# Patient Record
Sex: Female | Born: 1975 | Race: White | Hispanic: Yes | Marital: Married | Smoking: Never smoker
Health system: Southern US, Community
[De-identification: ages and names within clinical notes are randomized; demographics above are authoritative.]

---

## 2005-11-06 ENCOUNTER — Inpatient Hospital Stay (HOSPITAL_COMMUNITY): Admission: AD | Admit: 2005-11-06 | Discharge: 2005-11-06 | Payer: Self-pay | Admitting: Obstetrics & Gynecology

## 2005-12-13 ENCOUNTER — Ambulatory Visit: Payer: Self-pay | Admitting: Obstetrics and Gynecology

## 2005-12-13 ENCOUNTER — Inpatient Hospital Stay (HOSPITAL_COMMUNITY): Admission: AD | Admit: 2005-12-13 | Discharge: 2005-12-17 | Payer: Self-pay | Admitting: Obstetrics and Gynecology

## 2005-12-13 ENCOUNTER — Ambulatory Visit: Payer: Self-pay | Admitting: *Deleted

## 2005-12-28 ENCOUNTER — Ambulatory Visit: Payer: Self-pay | Admitting: Family Medicine

## 2006-01-11 ENCOUNTER — Ambulatory Visit: Payer: Self-pay | Admitting: *Deleted

## 2006-01-18 ENCOUNTER — Inpatient Hospital Stay (HOSPITAL_COMMUNITY): Admission: RE | Admit: 2006-01-18 | Discharge: 2006-01-18 | Payer: Self-pay | Admitting: *Deleted

## 2006-01-18 ENCOUNTER — Ambulatory Visit: Payer: Self-pay | Admitting: Family Medicine

## 2006-01-22 ENCOUNTER — Ambulatory Visit: Payer: Self-pay | Admitting: Family Medicine

## 2006-02-05 ENCOUNTER — Ambulatory Visit: Payer: Self-pay | Admitting: Family Medicine

## 2006-02-19 ENCOUNTER — Ambulatory Visit: Payer: Self-pay | Admitting: Family Medicine

## 2006-03-02 ENCOUNTER — Ambulatory Visit: Payer: Self-pay | Admitting: Internal Medicine

## 2006-03-05 ENCOUNTER — Ambulatory Visit: Payer: Self-pay | Admitting: Obstetrics & Gynecology

## 2006-03-12 ENCOUNTER — Ambulatory Visit: Payer: Self-pay | Admitting: Family Medicine

## 2006-03-20 ENCOUNTER — Encounter (INDEPENDENT_AMBULATORY_CARE_PROVIDER_SITE_OTHER): Payer: Self-pay | Admitting: *Deleted

## 2006-03-20 ENCOUNTER — Inpatient Hospital Stay (HOSPITAL_COMMUNITY): Admission: AD | Admit: 2006-03-20 | Discharge: 2006-03-24 | Payer: Self-pay | Admitting: Gynecology

## 2006-03-20 ENCOUNTER — Ambulatory Visit: Payer: Self-pay | Admitting: Gynecology

## 2006-03-27 ENCOUNTER — Inpatient Hospital Stay (HOSPITAL_COMMUNITY): Admission: AD | Admit: 2006-03-27 | Discharge: 2006-03-27 | Payer: Self-pay | Admitting: Family Medicine

## 2006-04-06 ENCOUNTER — Ambulatory Visit: Payer: Self-pay | Admitting: Gynecology

## 2006-04-07 ENCOUNTER — Observation Stay (HOSPITAL_COMMUNITY): Admission: AD | Admit: 2006-04-07 | Discharge: 2006-04-08 | Payer: Self-pay | Admitting: Obstetrics and Gynecology

## 2006-04-07 ENCOUNTER — Ambulatory Visit: Payer: Self-pay | Admitting: Certified Nurse Midwife

## 2006-04-23 ENCOUNTER — Ambulatory Visit: Payer: Self-pay | Admitting: Internal Medicine

## 2011-03-24 NOTE — Op Note (Signed)
NAME:  Colleen Booth, Colleen Booth       ACCOUNT NO.:  [id number]   MEDICAL RECORD NO.:  [id number]          PATIENT TYPE:  INP   LOCATION:  9317                          FACILITY:  [facility]   PHYSICIAN:  Lesly Dukes, M.D. DATE OF BIRTH:  12/31/1975   DATE OF PROCEDURE:  12/14/2005  DATE OF DISCHARGE:                                 OPERATIVE REPORT   PREOPERATIVE DIAGNOSES:  A 35 year old female, gravida 2, para 1, at 14  weeks 5 days' estimated gestational age, with short, slightly dilated  cervix.   POSTOPERATIVE DIAGNOSES:  A 35 year old female, gravida 2, para 1, at 3  weeks 5 days' estimated gestational age, with short, slightly dilated  cervix.   PROCEDURE:  Rescue McDonald cerclage.   SURGEON:  Lesly Dukes, M.D.   ESTIMATED BLOOD LOSS:  Minimal.   COMPLICATIONS:  None.   FINDINGS:  Cervix approximately 1.5-2 cm long, fingertip dilated.   PROCEDURE:  After informed consent was obtained, the patient was taken to  the operating room, and spinal anesthesia was adequate.  The patient was  placed in the dorsal lithotomy position and prepared and draped in a normal  sterile fashion.  A Foley was placed in the bladder.  Retractors were placed  into the vagina and the cervix was grasped with Allis clamps to place it on  tension.  A 0 Prolene stitch was used to sew the cervix shut just below the  bladder reflection in a circumferential pursestring fashion.  The knot was  tied anteriorly and a tag was left.  There was good hemostasis at the end of  the procedure.  The patient tolerated the procedure well.  The sponge, lap  and instrument count were correct x2.  The patient went to recovery in  stable condition.           ______________________________  Lesly Dukes, M.D.     KHL/MEDQ  D:  12/14/2005  T:  12/14/2005  Job:  [id number]

## 2011-03-24 NOTE — Op Note (Signed)
Colleen Booth, Colleen Booth       ACCOUNT NO.:  [id number]   MEDICAL RECORD NO.:  [id number]          PATIENT TYPE:  INP   LOCATION:  9303                          [facility]ACILITY:  [facility]   PHYSICIAN:  Ginger Carne, MD  DATE OF BIRTH:  1976/02/22   DATE OF PROCEDURE:  03/20/2006  DATE OF DISCHARGE:                                 OPERATIVE REPORT   PREOPERATIVE DIAGNOSES:  1.  28-3/[redacted] weeks gestation.  2.  Preterm premature rupture of membranes.  3.  Abruptio placenta.   POSTOPERATIVE DIAGNOSES:  1.  At 28-3/[redacted] weeks gestation.  2.  Preterm premature rupture of membranes.  3.  Abruptio placenta.  4.  Partial placenta accreta on anterior uterine wall.   PROCEDURE:  Classical repeat cesarean section.   SURGEON:  Ginger Carne, MD.   ASSISTANT:  None.   COMPLICATIONS:  None immediate.   ESTIMATED BLOOD LOSS:  800 mL.   ANESTHESIA:  General.   OPERATIVE FINDINGS:  Preterm infant female delivered on Mar 23, 2006, Apgar  and weight per delivery room record, no gross abnormalities.  Baby was bulb  suctioned and handed off to the NICU staff.  Amniotic fluid was  serosanguineous with blood clots present.  There was a 5 cm anterior accreta  on the mid portion of the uterus.  The patient appeared to have had a  classical cesarean section previously.  The accreta was left in situ and  over sewn with minimal bleeding noted.  Uterus, tubes and ovaries showed  normal decidual changes of pregnancy.   OPERATIVE PROCEDURE:  The patient prepped and draped in the usual fashion  and placed in the left lateral supine position.  Betadine solution was used  for antiseptic and the patient was catheterized prior to procedure.  After  adequate general anesthesia, a vertical infraumbilical incision was made and  the abdomen opened.  Classical vertical incision made on the uterus.  Baby  delivered, cord clamped and cut and infant given to the pediatric staff  after bulb suctioning.  Placenta was  sent for pathology.  Cord bloods and  cord pH obtained.  Cord pH was compromised by minimal amount of blood and  air trapped in said blood collection.  As aforementioned, a portion of the  accreta was left in situ to avoid significant hemorrhage.  This was oversewn  with closure in a single layer with 0 Vicryl running interlocking suture  from either end to midline.  Bleeding points hemostatically checked.  Blood  clots removed.  Closure of the vertical incision in one layer with 1-0 PDS running suture,  incorporating the fascia and portions of the peritoneum and skin staples for  the skin.  Instrument and sponge count were correct.  The patient tolerated  the procedure well and returned to the post anesthesia recovery room in  excellent condition.      Ginger Carne, MD  Electronically Signed     SHB/MEDQ  D:  03/20/2006  T:  03/21/2006  Job:  [id number]

## 2011-03-24 NOTE — Discharge Summary (Signed)
Colleen Booth, Colleen Booth      ACCOUNT NO.:  [id number]   MEDICAL RECORD NO.:  [id number]          PATIENT TYPE:  INP   LOCATION:                                [facility]   PHYSICIAN:  Ginger Carne, MD  DATE OF BIRTH:  [birth year]   DATE OF ADMISSION:  03/20/2006  DATE OF DISCHARGE:  03/24/2006                                 DISCHARGE SUMMARY   DISCHARGE DIAGNOSES:  1.  Stat classical repeat cesarean section.  2.  Preterm delivery.  3.  Preterm premature rupture of membranes.  4.  Abruptio placenta.  5.  Partial placenta accreta on interior uterine wall.  6.  Aortic stenosis, mild.   DISCHARGE MEDICATIONS:  1.  Percocet 5/325 mg one tab p.o. q.4-6 h. p.r.n.  2.  Prenatal vitamins one pill daily.   HOSPITAL COURSE:  This patient is a 35 year old G2, P1-1-0-1, who presented  to the hospital at 28 weeks and 3 days after rupturing membranes. The  patient was going to be give Beta Methasone and placed on antibiotics;  however, as soon as she got to her room she had profuse vaginal bleeding and  was taken to the OR for an immediate stat C-section.  Classical C-section  was performed and a preterm infant female was delivered. Apgar and weight  per delivery room record.  Baby was bulb suctioned and handed off to NICU  staff.  Amniotic fluid was serosanguineous with blood clots present.  There  was a 5 cm anterior accreta on the mid portion of the uterus.  It appeared  that the patient had a classical C-section previously.  The patient and baby  appeared to tolerate the procedure well.  Patient lost an estimated 1000cc  of blood and required transfusion on postoperative day #1 - her hemoglobin  had dropped to 6.1.  After the transfusion the patient did well.  She plans  on breast feeding her baby.  She is rubella immune.  She refused  contraception.  Of note the patient had been diagnosed with aortic stenosis  and had followed up with a cardiologist about two weeks ago and  was told  that everything was okay and she did not need further follow-up.  The  patient was rubella immune, B positive blood type, and GBS negative.  She  was discharged home on postoperative day #4.   FOLLOW UP:  1.  The patient is to return to the maternity admissions unit in four days      to have her staples removed.  2.  She is to return for a postpartum follow-up to the [facility] in      six weeks.      Altamese Cabal, M.D.      Ginger Carne, MD  Electronically Signed    KS/MEDQ  D:  03/24/2006  T:  03/24/2006  Job:  [id number]

## 2011-03-24 NOTE — Discharge Summary (Signed)
Colleen Booth, STIGGERS       ACCOUNT NO.:  [id number]   MEDICAL RECORD NO.:  [id number]          PATIENT TYPE:  INP   LOCATION:                                [facility]   PHYSICIAN:  Towana Badger, M.D.       DATE OF BIRTH:  1975-12-28   DATE OF ADMISSION:  04/07/2006  DATE OF DISCHARGE:  04/08/2006                                 DISCHARGE SUMMARY   DISCHARGE DIAGNOSES:  1.  Postoperative vaginal bleeding.  2.  Status post classical cesarean section.  3.  Status post placental abruption with accreta.   DISCHARGE MEDICATIONS:  Percocet 5/325 1 tablet q. 4-6 h p.r.n. for pain.   HOSPITAL COURSE:  The patient is a 35 year old, G2, P1-1-0-1 status post  classical cesarean section for abruptio placenta at 28 weeks with accreta.  The patient was 19 days postpartum on the day of admission. The patient was  admitted with a chief complaint of increased vaginal bleeding. The patient  was admitted on 23-hour observation, given IV fluid, Pitocin, Cytotec, and  Methergine. The patient's admission CBC was hemoglobin 10.1. The patient's  discharge CBC was hemoglobin 9.0. Beta HCG was negative. CMP was within  normal limits. The patient was afebrile and hemodynamically stable on  admission and remained this way throughout the hospital stay. Bleeding was  noted to be moderate on admission with no clots and with a total blood  volume saturating 1 pad. Overnight the patient had very little bleeding with  only scant spotting of the pad on day of discharge with dark old lochia. The  patient's abdomen throughout hospital stay was clean, dry and intact with  respect to her surgical scar and soft and nontender on physical exam. The  patient was discharged home with instructions to return to clinic if  bleeding resumed.   FOLLOWUP:  The patient instructed to call clinic on Monday, day after  discharge, to make followup appointment given that the patient was  discharged on a weekend.   ADDITIONAL DISCHARGE INSTRUCTIONS:  No dietary restrictions, increase  activities slowly, nothing per vagina for at least another 2 weeks.      Towana Badger, M.D.     JP/MEDQ  D:  04/08/2006  T:  04/09/2006  Job:  [id number]

## 2011-03-24 NOTE — Discharge Summary (Signed)
Colleen Booth, Colleen Booth       ACCOUNT NO.:  [id number]   MEDICAL RECORD NO.:  [id number]          PATIENT TYPE:  INP   LOCATION:  9317                          [facility]ILITY[facility]   PHYSICIAN:  Phil D. Okey Dupre, M.D.     DATE OF BIRTH:  1976-01-31   DATE OF ADMISSION:  12/13/2005  DATE OF DISCHARGE:  12/17/2005                                 DISCHARGE SUMMARY   ADMITTING DIAGNOSES:  1.  14-week intrauterine pregnancy.  2.  Short cervix noted by ultrasound.  3.  Heart murmur.   DISCHARGE DIAGNOSES:  1.  14-week intrauterine pregnancy.  2.  Status post cerclage.  3.  Mild to moderate aortic valve stenosis.   PROCEDURES:  1.  Cerclage by Dr. Penne Lash.  2.  Echocardiogram done by Dr. Dorethea Clan.   ADMITTING HISTORY AND PHYSICAL:  Patient is a 35 year old G2, P1-0-0-0 who  is approximately [redacted] weeks pregnant by an 8-week ultrasound who was noted to  have short cervix on her first OB visit on February 6.  The ultrasound  showed only 3.4 mm of cervical length.  Her cervical examination also  revealed a shortened cervix.  Patient's first pregnancy was post dates and  the baby died shortly after birth.  The delivery was via emergent cesarean  section.   HOSPITAL COURSE:  Patient was admitted.  She was started on Unasyn.  She was  placed on bed rest.  She underwent her cerclage on December 14, 2005 by Dr.  Penne Lash.  Please see op note for more information.  She did have an open  cervix with the membranes showing.  A McDonald cerclage was performed.  Patient tolerated procedure well.  She remained stable after her cerclage,  complained of no bleeding.  She was without pain.  Patient remained on IV  antibiotics.  She slowly increased her activity and did well.  On admission  she was noted to have a murmur.  An echocardiogram was done on December 16, 2005 by Dr. Dorethea Clan.  Patient was found to have mild to moderate aortic valve  stenosis.  The physician on-call for [place], Dr. Dietrich Pates, was called  and  stated that he will arrange an outpatient cardiology appointment for the  patient and states that she is asymptomatic including no angina, syncope, or  shortness of breath.  Patient likely needs no further treatment except  bacterial endocarditis prophylaxis including when she presents for a vaginal  delivery.   DISCHARGE INSTRUCTIONS:  Patient was discharged to home.  Her activity was  modified bed rest.  Her medications include prenatal vitamins.  Patient is  to follow up in high risk clinic.  She will be called on Monday with an  appointment time and date.  She will be called by the cardiology with  appointment time and date as well.     ______________________________  August Saucer. Merlene Morse, MD    ______________________________  Javier Glazier Okey Dupre, M.D.    ABC/MEDQ  D:  12/17/2005  T:  12/18/2005  Job:  [id number]   cc:   [facility]

## 2013-05-31 ENCOUNTER — Encounter (HOSPITAL_COMMUNITY): Payer: Self-pay | Admitting: Emergency Medicine

## 2013-05-31 ENCOUNTER — Emergency Department (INDEPENDENT_AMBULATORY_CARE_PROVIDER_SITE_OTHER): Payer: Worker's Compensation

## 2013-05-31 ENCOUNTER — Emergency Department (INDEPENDENT_AMBULATORY_CARE_PROVIDER_SITE_OTHER)
Admission: EM | Admit: 2013-05-31 | Discharge: 2013-05-31 | Disposition: A | Discharge status: Home / Self Care | Payer: Worker's Compensation | Source: Home / Self Care | Attending: Emergency Medicine | Admitting: Emergency Medicine

## 2013-05-31 DIAGNOSIS — M549 Dorsalgia, unspecified: Secondary | ICD-10-CM

## 2013-05-31 DIAGNOSIS — W19XXXA Unspecified fall, initial encounter: Secondary | ICD-10-CM

## 2013-05-31 MED ORDER — IBUPROFEN 800 MG PO TABS: 800.0000 mg | ORAL_TABLET | Freq: Three times a day (TID) | ORAL | Status: DC | PRN

## 2013-05-31 MED ORDER — TRAMADOL HCL 50 MG PO TABS: 50.0000 mg | ORAL_TABLET | Freq: Four times a day (QID) | ORAL | Status: DC | PRN

## 2013-05-31 MED ORDER — KETOROLAC TROMETHAMINE 60 MG/2ML IM SOLN
INTRAMUSCULAR | Status: AC
  Filled 2013-05-31: qty 2

## 2013-05-31 MED ORDER — METHYLPREDNISOLONE ACETATE 40 MG/ML IJ SUSP
80.0000 mg | Freq: Once | INTRAMUSCULAR | Status: AC
  Administered 2013-05-31: 80 mg via INTRAMUSCULAR

## 2013-05-31 MED ORDER — METHYLPREDNISOLONE ACETATE 80 MG/ML IJ SUSP
INTRAMUSCULAR | Status: AC
  Filled 2013-05-31: qty 1

## 2013-05-31 MED ORDER — KETOROLAC TROMETHAMINE 60 MG/2ML IM SOLN
60.0000 mg | Freq: Once | INTRAMUSCULAR | Status: AC
  Administered 2013-05-31: 60 mg via INTRAMUSCULAR

## 2013-05-31 NOTE — ED Provider Notes (Signed)
Medical screening examination/treatment/procedure(s) were performed by non-physician practitioner and as supervising physician I was immediately available for consultation/collaboration.  Leslee Home, M.D.  Reuben Likes, MD 05/31/13 [phone]

## 2013-05-31 NOTE — ED Notes (Signed)
Pt reports walking down steps and slipped on paper causing her to fall down the steps injuring her back. States incident happened this morning around 8 a.m. Pain gradually getting worse.  Pt has not taken any otc meds for pain.

## 2013-05-31 NOTE — ED Provider Notes (Signed)
CSN: [id number]     Arrival date & time 05/31/13  1058 History     None    Chief Complaint  Patient presents with  . Fall    pt fell down steps this a.m    (Consider location/radiation/quality/duration/timing/severity/associated sxs/prior Treatment) HPI Comments: History obtained via her Ramn, CMA, translator. 37 year old female presents complaining of lower back pain radiating down both her legs since falling at work 4 hours ago. She was walking down steps and slipped on a piece of trash, falling backwards. She caught herself with her arms and did not actually impacts the ground. Since then, she has pain in the lower back midline and worked out to the right side, but there is pain on both sides of the spine as well. The pain occasionally shoots to her right leg down to about the knee. She denies any loss of bowel or bladder function, saddle anesthesia, or any other symptoms.  Patient is a 37 y.o. female presenting with fall.  Fall Pertinent negatives include no chest pain, no abdominal pain and no shortness of breath.    History reviewed. No pertinent past medical history. History reviewed. No pertinent past surgical history. History reviewed. No pertinent family history. History  Substance Use Topics  . Smoking status: Never Smoker   . Smokeless tobacco: Not on file  . Alcohol Use: No   OB History   Grav Para Term Preterm Abortions TAB SAB Ect Mult Living                 Review of Systems  Constitutional: Negative for fever and chills.  Eyes: Negative for visual disturbance.  Respiratory: Negative for cough and shortness of breath.   Cardiovascular: Negative for chest pain, palpitations and leg swelling.  Gastrointestinal: Negative for nausea, vomiting and abdominal pain.  Endocrine: Negative for polydipsia and polyuria.  Genitourinary: Negative for dysuria, urgency and frequency.  Musculoskeletal: Positive for back pain. Negative for myalgias and arthralgias.  Skin:  Negative for rash.  Neurological: Negative for dizziness, weakness and light-headedness.    Allergies  Review of patient's allergies indicates no known allergies.  Home Medications   Current Outpatient Rx  Name  Route  Sig  Dispense  Refill  . ibuprofen (ADVIL,MOTRIN) 800 MG tablet   Oral   Take 1 tablet (800 mg total) by mouth every 8 (eight) hours as needed for pain.   30 tablet   0   . traMADol (ULTRAM) 50 MG tablet   Oral   Take 1 tablet (50 mg total) by mouth every 6 (six) hours as needed for pain.   20 tablet   0    BP 133/79  Pulse 71  Temp(Src) 98.3 F (36.8 C) (Oral)  Resp 12  SpO2 100%  LMP 05/31/2013 Physical Exam  Nursing note and vitals reviewed. Constitutional: She is oriented to person, place, and time. Vital signs are normal. She appears well-developed and well-nourished. She appears distressed ( mild).  HENT:  Head: Normocephalic and atraumatic.  Eyes: EOM are normal. Pupils are equal, round, and reactive to light.  Cardiovascular: Exam reveals no friction rub.   Pulmonary/Chest: Effort normal.  Abdominal: Soft. There is no tenderness.  Musculoskeletal:       Lumbar back: She exhibits decreased range of motion, tenderness, bony tenderness and spasm. She exhibits no swelling and no deformity.  Neurological: She is alert and oriented to person, place, and time. She has normal strength and normal reflexes. No sensory deficit.  Skin: Skin is  warm and dry. No rash noted. She is not diaphoretic.  Psychiatric: She has a normal mood and affect. Her behavior is normal. Judgment normal.    ED Course   Procedures (including critical care time)  Labs Reviewed - No data to display Dg Lumbar Spine Complete  05/31/2013   *RADIOLOGY REPORT*  Clinical Data: Back pain status post fall.  LUMBAR SPINE - COMPLETE 4+ VIEW  Comparison: None.  Findings: There are five lumbar type vertebral bodies.  The alignment is normal.  The disc spaces are preserved.  There is no  evidence of acute fracture or pars defect.  There is a probable bone island within the right iliac bone.  IMPRESSION: No acute osseous findings or malalignment.   Original Report Authenticated By: Carey Bullocks, M.D.   1. Back pain   2. Fall, initial encounter     MDM  No radiographic evidence of acute bony abnormality. Toradol and Depo-Medrol given here. Treat with NSAIDs and tramadol as needed. Followup if not improving in a week   Meds ordered this encounter  Medications  . ketorolac (TORADOL) injection 60 mg    Sig:   . methylPREDNISolone acetate (DEPO-MEDROL) injection 80 mg    Sig:   . ibuprofen (ADVIL,MOTRIN) 800 MG tablet    Sig: Take 1 tablet (800 mg total) by mouth every 8 (eight) hours as needed for pain.    Dispense:  30 tablet    Refill:  0  . traMADol (ULTRAM) 50 MG tablet    Sig: Take 1 tablet (50 mg total) by mouth every 6 (six) hours as needed for pain.    Dispense:  20 tablet    Refill:  0   \  Graylon Good, PA-C 05/31/13 1644

## 2014-08-29 IMAGING — CR DG LUMBAR SPINE COMPLETE 4+V
5 series · 5 of 5 positions shown · non-contrast
Comparison: None.

CLINICAL DATA: Back pain status post fall.

LUMBAR SPINE - COMPLETE 4+ VIEW

[view not recorded (1 of 5)]
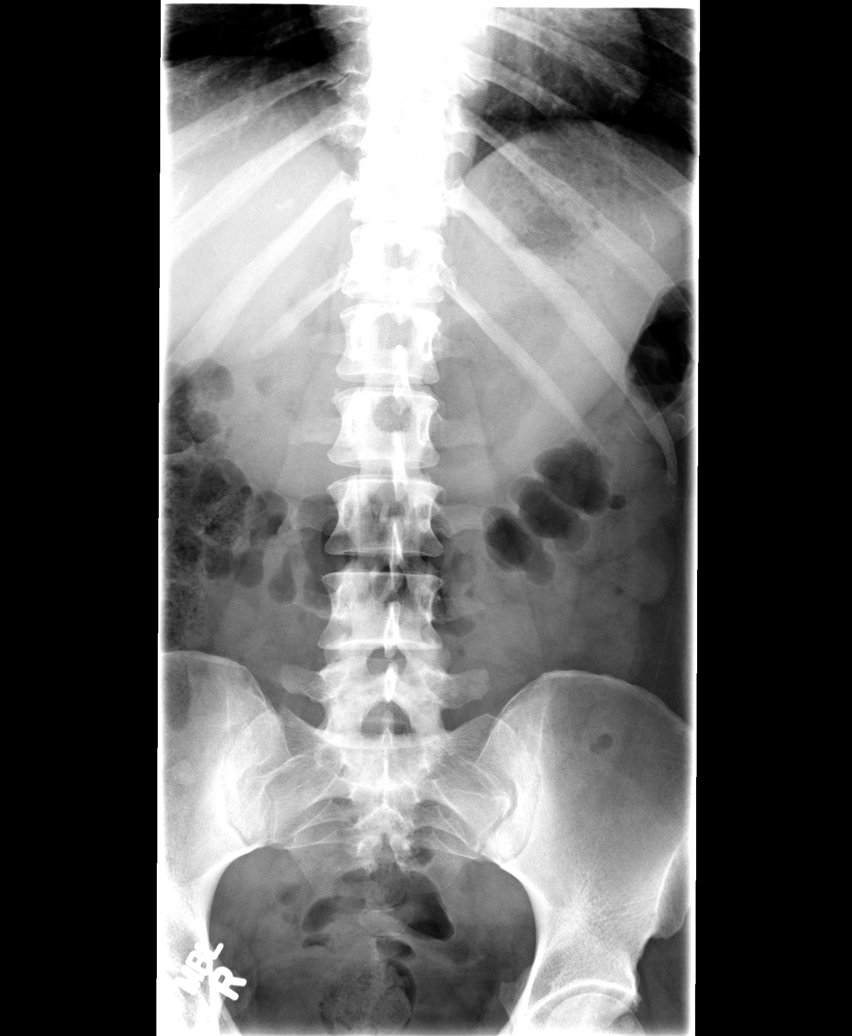

[view not recorded (2 of 5)]
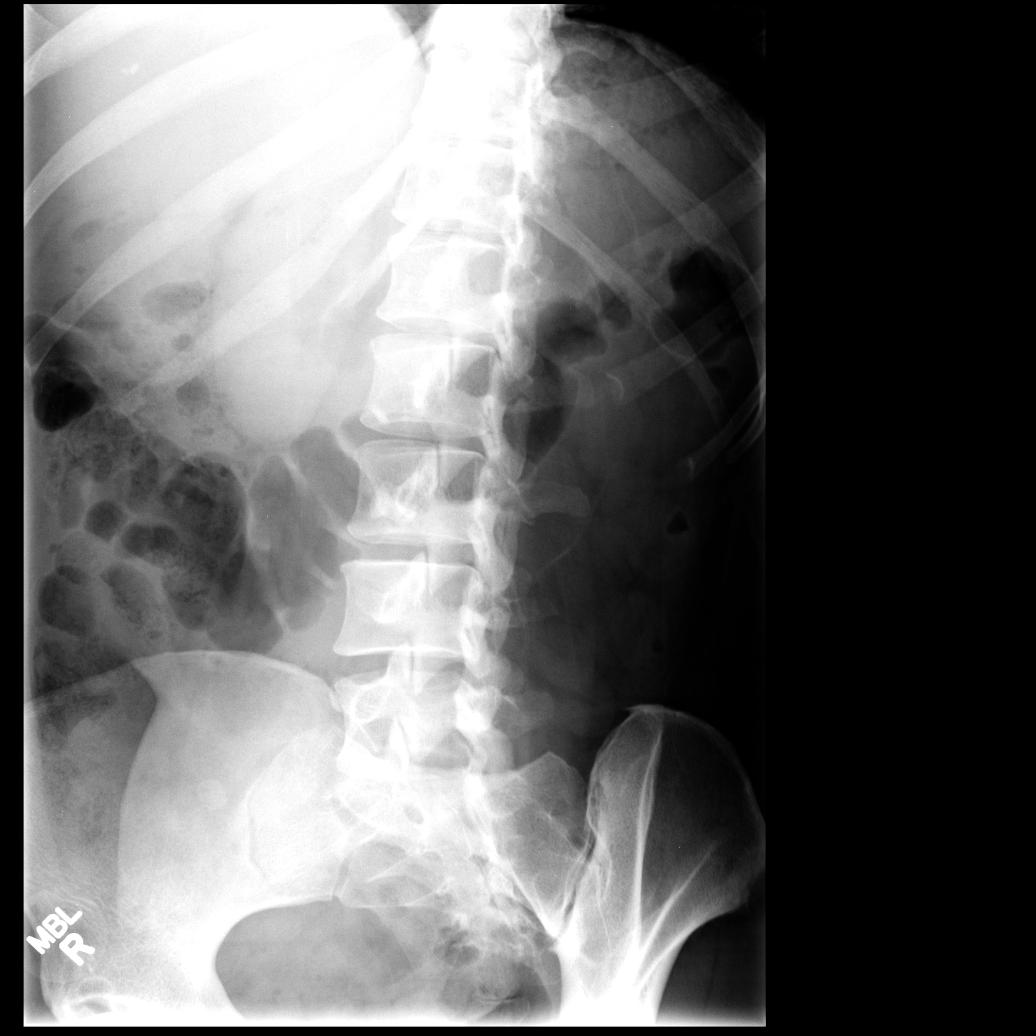

[view not recorded (3 of 5)]
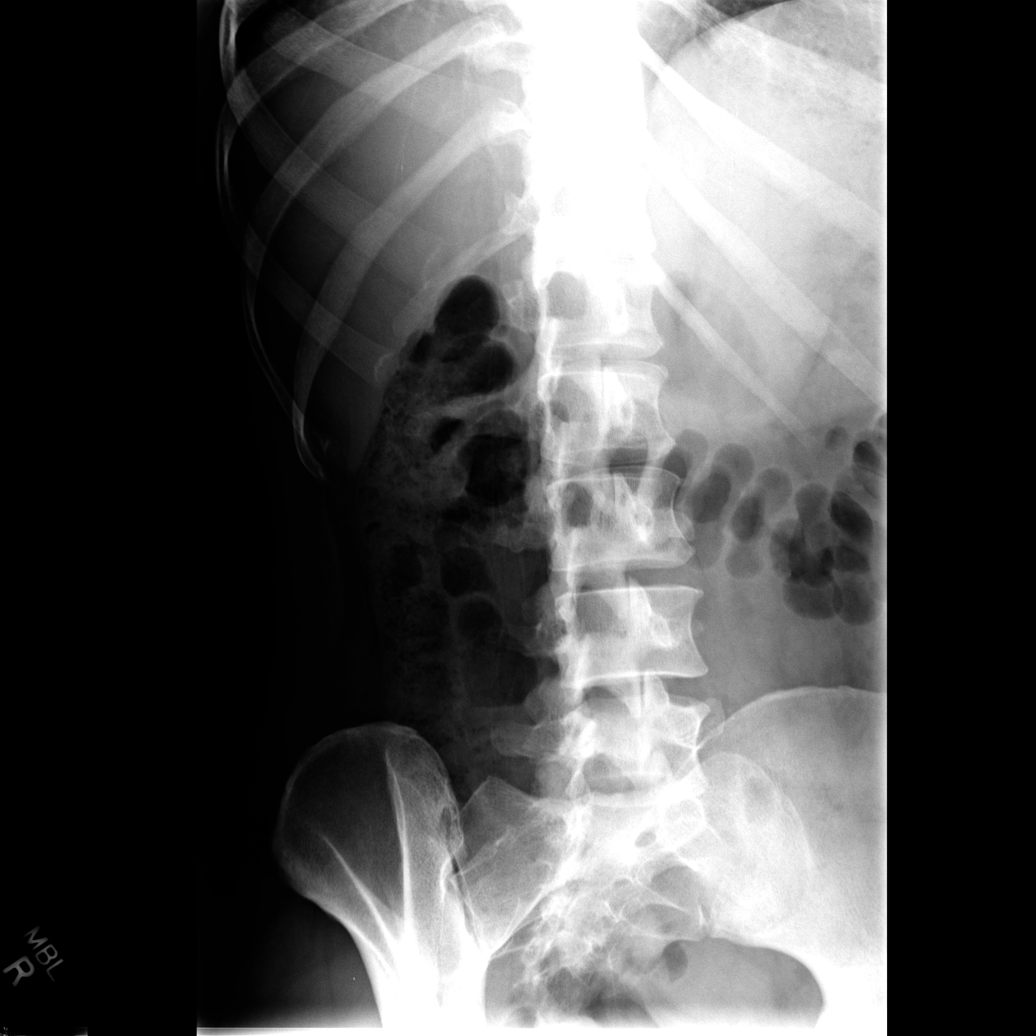

[view not recorded (4 of 5)]
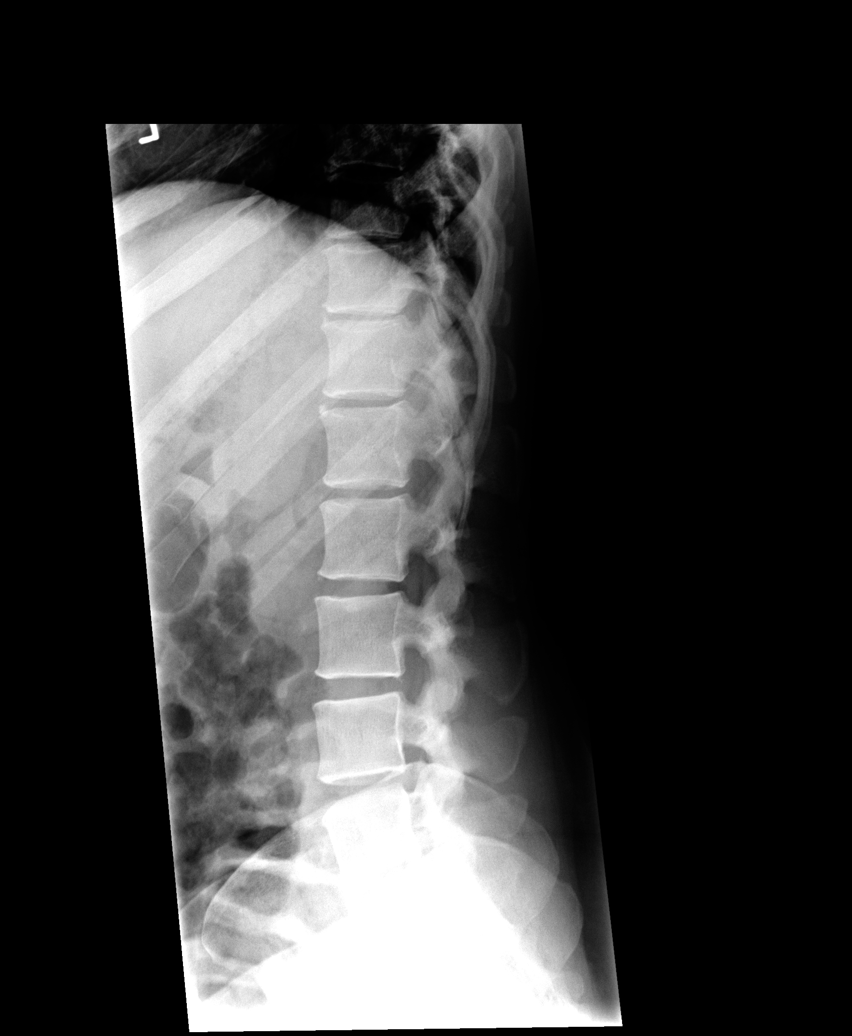

[view not recorded (5 of 5)]
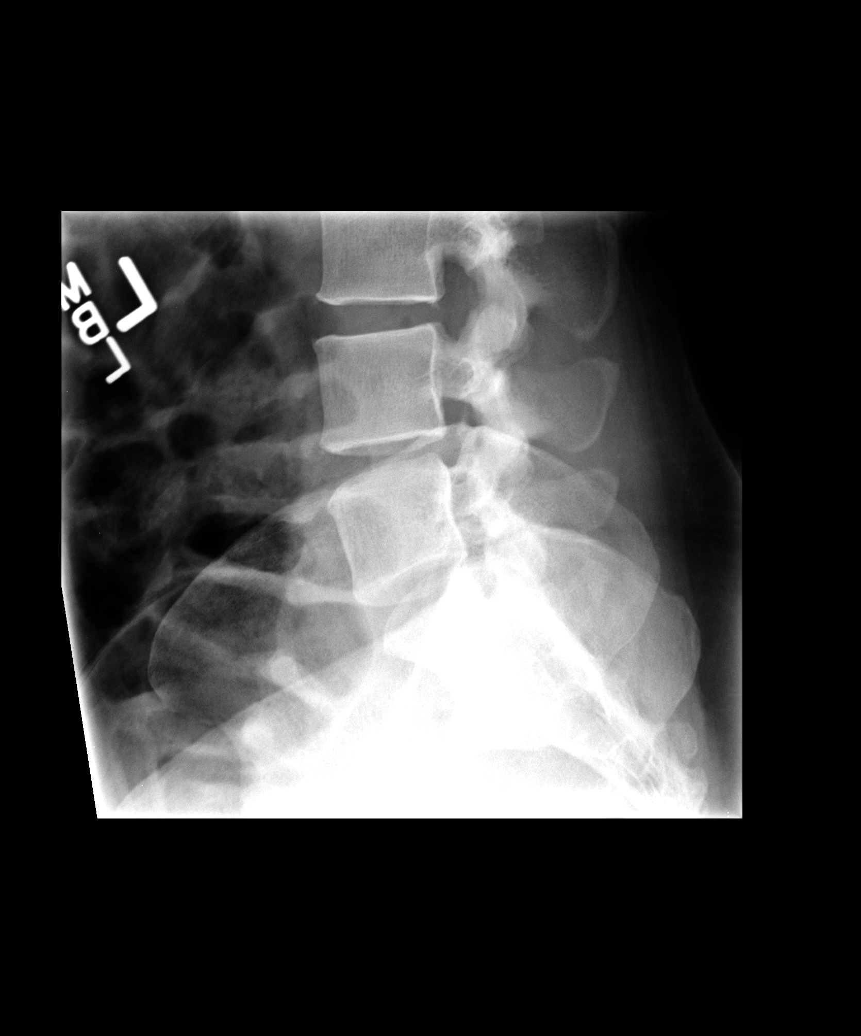

[5 of 5 positions shown; findings below may reference images not displayed]

FINDINGS: There are five lumbar type vertebral bodies.  The
alignment is normal.  The disc spaces are preserved.  There is no
evidence of acute fracture or pars defect.  There is a probable
bone island within the right iliac bone.
IMPRESSION: No acute osseous findings or malalignment.

## 2015-09-02 ENCOUNTER — Ambulatory Visit: Payer: Self-pay | Admitting: Internal Medicine

## 2017-03-20 ENCOUNTER — Other Ambulatory Visit: Payer: Self-pay | Admitting: Obstetrics & Gynecology

## 2017-03-20 DIAGNOSIS — Z1231 Encounter for screening mammogram for malignant neoplasm of breast: Secondary | ICD-10-CM

## 2017-04-17 ENCOUNTER — Ambulatory Visit
Admission: RE | Admit: 2017-04-17 | Discharge: 2017-04-17 | Disposition: A | Discharge status: Home / Self Care | Payer: No Typology Code available for payment source | Source: Ambulatory Visit | Attending: Obstetrics & Gynecology | Admitting: Obstetrics & Gynecology

## 2017-04-17 DIAGNOSIS — Z1231 Encounter for screening mammogram for malignant neoplasm of breast: Secondary | ICD-10-CM

## 2017-09-11 ENCOUNTER — Ambulatory Visit (INDEPENDENT_AMBULATORY_CARE_PROVIDER_SITE_OTHER): Payer: Self-pay | Admitting: Physician Assistant

## 2017-09-11 ENCOUNTER — Encounter (INDEPENDENT_AMBULATORY_CARE_PROVIDER_SITE_OTHER): Payer: Self-pay | Admitting: Physician Assistant

## 2017-09-11 VITALS — BP 112/74 | HR 63 | Temp 97.9°F | Ht <= 58 in | Wt 112.8 lb

## 2017-09-11 DIAGNOSIS — R5383 Other fatigue: Secondary | ICD-10-CM

## 2017-09-11 DIAGNOSIS — F329 Major depressive disorder, single episode, unspecified: Secondary | ICD-10-CM

## 2017-09-11 DIAGNOSIS — R0602 Shortness of breath: Secondary | ICD-10-CM

## 2017-09-11 DIAGNOSIS — F32A Depression, unspecified: Secondary | ICD-10-CM

## 2017-09-11 MED ORDER — ESCITALOPRAM OXALATE 10 MG PO TABS: 10.0000 mg | ORAL_TABLET | Freq: Every day | ORAL | 1 refills | Status: DC

## 2017-09-11 NOTE — Patient Instructions (Signed)
Depresin mayor (Major Depressive Disorder) La depresin mayor es un trastorno del estado de nimo. No es lo mismo que sentir tristeza cuando ocurre algo malo. Este trastorno puede durar das o semanas. Trabajar o hacer cosas con la familia y los amigos puede volverse difcil debido a este trastorno. La depresin mayor puede causar lo siguiente en las personas que la padecen:  Tristeza.  Vaco.  Desesperanza.  Impotencia.  Irritabilidad. Adems de estos sentimientos, las personas que padecen este trastorno tienen por lo menos 4de estos sntomas:  Problemas para dormir.  Dormir demasiado.  Un cambio importante en el apetito.  Un cambio importante en el peso.  Falta de energa.  Sentimientos de culpa o inutilidad.  Dificultad para concentrarse, recordar o tomar decisiones.  Movimientos lentos.  Agitacin.  Pensamientos o sueos de suicidio, o de daarse a s mismas.  Intento de suicidio. CUIDADOS EN EL HOGAR  Tome los medicamentos de venta libre y los recetados solamente como se lo haya indicado el mdico.  Concurra a todas las visitas de control como se lo haya indicado el mdico. Esto incluye cualquier terapia que le recomiende el mdico.  Reduzca el nivel de estrs. Haga cosas que disfruta, como andar en bicicleta, caminar o leer un libro.  Haga ejercicio fsico con frecuencia.  Consuma una dieta saludable.  No beba alcohol.  No consuma drogas.  Busque el apoyo de sus familiares y amigos.  SOLICITE AYUDA DE INMEDIATO SI:  Empieza a escuchar voces.  Ve cosas que no existen.  Siente que las personas lo persiguen (paranoico).  Tiene pensamientos serios acerca de lastimarse a usted mismo o daar a otras personas.  Piensa en el suicidio.  Esta informacin no tiene como fin reemplazar el consejo del mdico. Asegrese de hacerle al mdico cualquier pregunta que tenga. Document Released: 10/04/2015 Document Revised: 10/04/2015 Document Reviewed:  11/18/2012 Elsevier Interactive Patient Education  2017 Elsevier Inc.  

## 2017-09-11 NOTE — Progress Notes (Signed)
Subjective:  Patient ID: Colleen PasturesZoila Booth, female    DOB: [birth year]  Age: 41 y.o. MRN: [id number]  CC:   HPI Colleen MustacheZoila Booth is a 41 y.o. female with no significant medical history presents with shortness of breath and fatigue since two months ago. Associated with cough and clearing of the throat throughout the day. Feels mucus at the back of throat but does not endorse nasal congestion. No close contacts with the same. Never exposed to anyone with tuberculosis. Denies hx of asthma. No chest pain, palpitations, f/c/n/v, rash, or GI/GU sxs.         Outpatient Medications Prior to Visit  Medication Sig Dispense Refill  . ibuprofen (ADVIL,MOTRIN) 800 MG tablet Take 1 tablet (800 mg total) by mouth every 8 (eight) hours as needed for pain. (Patient not taking: Reported on 09/11/2017) 30 tablet 0  . traMADol (ULTRAM) 50 MG tablet Take 1 tablet (50 mg total) by mouth every 6 (six) hours as needed for pain. (Patient not taking: Reported on 09/11/2017) 20 tablet 0   No facility-administered medications prior to visit.      ROS Review of Systems  Constitutional: Positive for malaise/fatigue. Negative for chills and fever.  Eyes: Negative for blurred vision.  Respiratory: Positive for shortness of breath.   Cardiovascular: Negative for chest pain and palpitations.  Gastrointestinal: Negative for abdominal pain and nausea.  Genitourinary: Negative for dysuria and hematuria.  Musculoskeletal: Negative for joint pain and myalgias.  Skin: Negative for rash.  Neurological: Negative for tingling and headaches.  Psychiatric/Behavioral: Negative for depression. The patient is not nervous/anxious.     Objective:  BP 112/74 (BP Location: Left Arm, Patient Position: Sitting, Cuff Size: Normal)   Pulse 63   Temp 97.9 F (36.6 C) (Oral)   Ht 4' 9.48" (1.46 m)   Wt 112 lb 12.8 oz (51.2 kg)   LMP 09/03/2017 (Exact Date)   SpO2 100%   BMI 24.00 kg/m   BP/Weight 09/11/2017 05/31/2013   Systolic BP 112 133  Diastolic BP 74 79  Wt. (Lbs) 112.8 -  BMI 24 -      Physical Exam  Constitutional: She is oriented to person, place, and time.  Well developed, well nourished, NAD, polite  HENT:  Head: Normocephalic and atraumatic.  Mild postnasal drip. Oropharynx without erythema or exudates. Tonsils 2+  Eyes: No scleral icterus.  Neck: Normal range of motion. Neck supple. No thyromegaly present.  Cardiovascular: Normal rate, regular rhythm and normal heart sounds.  Pulmonary/Chest: Effort normal and breath sounds normal. No respiratory distress. She has no wheezes. She has no rales.  Musculoskeletal: She exhibits no edema.  Lymphadenopathy:    She has no cervical adenopathy.  Neurological: She is alert and oriented to person, place, and time. No cranial nerve deficit. Coordination normal.  Skin: Skin is warm and dry. No rash noted. No erythema. No pallor.  Psychiatric: She has a normal mood and affect. Her behavior is normal. Thought content normal.  Vitals reviewed.    Assessment & Plan:   1. Shortness of breath - Likely not pulmonary in etiology but will assess labs and reassess in 2-4 weeks. CXR if there is still complaint of SOB. - CBC with Differential - Comprehensive metabolic panel - TSH  2. Other fatigue - CBC with Differential - Comprehensive metabolic panel - TSH  3. Depression, unspecified depression type - PHQ9 score 12, GAD7 score 6 - Begin escitalopram   Meds ordered this encounter  Medications  . escitalopram (LEXAPRO) 10  MG tablet    Sig: Take 1 tablet (10 mg total) daily by mouth.    Dispense:  30 tablet    Refill:  1    Order Specific Question:   Supervising Provider    Answer:   Colleen Booth, Colleen E [id number]]    Follow-up: Return in about 4 weeks (around 10/09/2017) for depression.   Colleen Specteroger David Rhianon Zabawa PA

## 2017-09-12 LAB — COMPREHENSIVE METABOLIC PANEL
ALBUMIN: 4.5 g/dL (ref 3.5–5.5)
ALK PHOS: 97 IU/L (ref 39–117)
ALT: 23 IU/L (ref 0–32)
AST: 21 IU/L (ref 0–40)
Albumin/Globulin Ratio: 1.8 (ref 1.2–2.2)
BUN / CREAT RATIO: 10 (ref 9–23)
BUN: 7 mg/dL (ref 6–24)
Bilirubin Total: 0.4 mg/dL (ref 0.0–1.2)
CO2: 26 mmol/L (ref 20–29)
CREATININE: 0.69 mg/dL (ref 0.57–1.00)
Calcium: 9.4 mg/dL (ref 8.7–10.2)
Chloride: 99 mmol/L (ref 96–106)
GFR, EST AFRICAN AMERICAN: 125 mL/min/{1.73_m2} (ref >59)
GFR, EST NON AFRICAN AMERICAN: 108 mL/min/{1.73_m2} (ref >59)
GLOBULIN, TOTAL: 2.5 g/dL (ref 1.5–4.5)
GLUCOSE: 75 mg/dL (ref 65–99)
Potassium: 4.3 mmol/L (ref 3.5–5.2)
SODIUM: 140 mmol/L (ref 134–144)
TOTAL PROTEIN: 7 g/dL (ref 6.0–8.5)

## 2017-09-12 LAB — CBC WITH DIFFERENTIAL/PLATELET
BASOS ABS: 0 10*3/uL (ref 0.0–0.2)
Basos: 0 %
EOS (ABSOLUTE): 0.2 10*3/uL (ref 0.0–0.4)
EOS: 2 %
HEMATOCRIT: 36 % (ref 34.0–46.6)
HEMOGLOBIN: 12.4 g/dL (ref 11.1–15.9)
IMMATURE GRANS (ABS): 0 10*3/uL (ref 0.0–0.1)
Immature Granulocytes: 0 %
LYMPHS ABS: 2.3 10*3/uL (ref 0.7–3.1)
LYMPHS: 35 %
MCH: 30.5 pg (ref 26.6–33.0)
MCHC: 34.4 g/dL (ref 31.5–35.7)
MCV: 89 fL (ref 79–97)
MONOCYTES: 8 %
Monocytes Absolute: 0.5 10*3/uL (ref 0.1–0.9)
NEUTROS ABS: 3.6 10*3/uL (ref 1.4–7.0)
Neutrophils: 55 %
Platelets: 314 10*3/uL (ref 150–379)
RBC: 4.07 x10E6/uL (ref 3.77–5.28)
RDW: 14.2 % (ref 12.3–15.4)
WBC: 6.6 10*3/uL (ref 3.4–10.8)

## 2017-09-12 LAB — TSH: TSH: 0.699 u[IU]/mL (ref 0.450–4.500)

## 2017-09-13 ENCOUNTER — Telehealth (INDEPENDENT_AMBULATORY_CARE_PROVIDER_SITE_OTHER): Payer: Self-pay

## 2017-09-13 NOTE — Telephone Encounter (Signed)
Using pacific interpreter natalia [phone] left patient a message to call the office. If patient calls please provide  the following results. Labs are completely normal, symptoms likely due to depression and anxiety, continue taking escitalopram. Colleen Booth S Shivaay Stormont, CMA

## 2017-09-13 NOTE — Telephone Encounter (Signed)
-----   Message from Loletta Specteroger David Gomez, PA-C sent at 09/12/2017  5:42 PM EST ----- Labs are completely normal. Symptoms likely due to depression and anxiety. Continue taking escitalopram.

## 2017-10-09 ENCOUNTER — Ambulatory Visit (INDEPENDENT_AMBULATORY_CARE_PROVIDER_SITE_OTHER): Payer: Self-pay | Admitting: Physician Assistant

## 2017-10-09 ENCOUNTER — Encounter (INDEPENDENT_AMBULATORY_CARE_PROVIDER_SITE_OTHER): Payer: Self-pay | Admitting: Physician Assistant

## 2017-10-09 VITALS — BP 114/79 | HR 63 | Temp 97.9°F | Wt 112.6 lb

## 2017-10-09 DIAGNOSIS — N921 Excessive and frequent menstruation with irregular cycle: Secondary | ICD-10-CM

## 2017-10-09 DIAGNOSIS — F418 Other specified anxiety disorders: Secondary | ICD-10-CM

## 2017-10-09 DIAGNOSIS — Z114 Encounter for screening for human immunodeficiency virus [HIV]: Secondary | ICD-10-CM

## 2017-10-09 MED ORDER — ESCITALOPRAM OXALATE 10 MG PO TABS: 10.0000 mg | ORAL_TABLET | Freq: Every day | ORAL | 2 refills | Status: AC

## 2017-10-09 NOTE — Patient Instructions (Addendum)
Metrorragia (Metrorrhagia) La metrorragia es una hemorragia anormal procedente del tero. Generalmente, la hemorragia se presenta entre una menstruacin y la siguiente, y ocurre con frecuencia. CUIDADOS EN EL HOGAR Est atenta a los cambios en los sntomas. Estas indicaciones pueden ayudarla con el trastorno: Comidas  Consuma muchos tipos de alimentos.  Consuma los alimentos que contienen el nutriente llamado hierro. Algunos alimentos que contienen hierro son los siguientes: ? Hgado. ? Carne. ? Mariscos. ? Verduras de hojas verdes. ? Huevos.  Si tiene problemas para defecar (estreimiento): ? Beba abundante agua. ? Consuma frutas y verduras con alto contenido de fibra, como espinaca, zanahorias, frambuesas, manzanas y mango. Medicamentos  Tome los medicamentos de venta libre y los recetados solamente como se lo haya indicado el mdico.  No haga cambie los medicamentos sin hablar con el mdico.  No tome aspirina ni medicamentos que la contengan: ? Durante la semana previa a la menstruacin. ? Durante la menstruacin.  Tome los comprimidos de hierro exactamente como se lo haya indicado el mdico. Actividad  Debe cambiarse el apsito o el tampn ms de una vez en un lapso de 2 horas. ? Acustese con los pies elevados. ? Colquese una compresa fra en la parte baja del vientre (abdomen). ? Descanse todo lo que pueda.  No trate de adelgazar hasta que la hemorragia se detenga y los niveles de hierro en la sangre se normalicen. Otras indicaciones  Durante dos meses, anote lo siguiente: ? La fecha de comienzo de la menstruacin. ? La fecha de su finalizacin. ? Cuando tenga hemorragias que no ocurran durante la menstruacin. ? Los problemas que advierte.  Concurra a todas las visitas de control como se lo haya indicado el mdico. Esto es importante. SOLICITE AYUDA SI:  Siente mareos.  Siente como si se fuera a desmayar.  Se siente dbil.  Siente malestar estomacal  (nuseas).  Vomita.  No puede comer o tomar lquido sin vomitar.  Siente mareos mientras toma los medicamentos.  Tiene heces acuosas (diarrea) mientras toma los medicamentos.  Desea cambiar los anticonceptivos o las hormonas que toma.  Desea dejar de tomar los anticonceptivos o las hormonas. SOLICITE AYUDA DE INMEDIATO SI:  Tiene fiebre.  Tiene escalofros.  Debe cambiarse el apsito o el tampn ms de una vez en una hora.  Tiene una hemorragia vaginal que es ms abundante que antes.  Elimina cogulos de sangre por la vagina.  Tiene dolor en el vientre.  Se desmaya.  Tiene una erupcin cutnea. Esta informacin no tiene como fin reemplazar el consejo del mdico. Asegrese de hacerle al mdico cualquier pregunta que tenga. Document Released: 04/23/2012 Document Revised: 07/14/2015 Document Reviewed: 01/18/2015 Elsevier Interactive Patient Education  2018 Elsevier Inc.  

## 2017-10-09 NOTE — Progress Notes (Signed)
Subjective:  Patient ID: Colleen Booth, female    DOB: 17-Jul-1976  Age: 41 y.o. MRN: [id number]  CC: f/u depression  HPI Colleen Booth is a 41 y.o. female with a recent medical history of depression with anxiety presents to f/u on the same. Says she is feeling better and does not feel as stressed or worried. Also has noted her shortness of breath is much better. Only complaint today is of intermenstrual bleeding over the past two months. Bleeding is light. Has never happened before. Not sexually active. Does not endorse pain outside of her normal menstrual cramping. No fatigue, fever, chills, nausea, vomiting, CP, palpitations, headache, or abdominal pain.        Outpatient Medications Prior to Visit  Medication Sig Dispense Refill  . escitalopram (LEXAPRO) 10 MG tablet Take 1 tablet (10 mg total) daily by mouth. 30 tablet 1   No facility-administered medications prior to visit.      ROS Review of Systems  Constitutional: Negative for chills, fever and malaise/fatigue.  Eyes: Negative for blurred vision.  Respiratory: Negative for shortness of breath.   Cardiovascular: Negative for chest pain and palpitations.  Gastrointestinal: Negative for abdominal pain and nausea.  Genitourinary: Negative for dysuria and hematuria.  Musculoskeletal: Negative for joint pain and myalgias.  Skin: Negative for rash.  Neurological: Negative for tingling and headaches.  Psychiatric/Behavioral: Negative for depression. The patient is not nervous/anxious.     Objective:  BP 114/79 (BP Location: Left Arm, Patient Position: Sitting, Cuff Size: Normal)   Pulse 63   Temp 97.9 F (36.6 C) (Oral)   Wt 112 lb 9.6 oz (51.1 kg)   LMP 08/15/2017 (Approximate)   SpO2 97%   BMI 23.96 kg/m   BP/Weight 10/09/2017 09/11/2017 05/31/2013  Systolic BP 114 112 133  Diastolic BP 79 74 79  Wt. (Lbs) 112.6 112.8 -  BMI 23.96 24 -      Physical Exam  Constitutional: She is oriented to  person, place, and time.  Well developed, well nourished, NAD, polite  HENT:  Head: Normocephalic and atraumatic.  Eyes: No scleral icterus.  Neck: Normal range of motion. Neck supple. No thyromegaly present.  Cardiovascular: Normal rate, regular rhythm and normal heart sounds.  Pulmonary/Chest: Effort normal and breath sounds normal.  Abdominal: Soft. Bowel sounds are normal. There is no tenderness.  Musculoskeletal: She exhibits no edema.  Neurological: She is alert and oriented to person, place, and time.  Skin: Skin is warm and dry. No rash noted. No erythema. No pallor.  Psychiatric: She has a normal mood and affect. Her behavior is normal. Thought content normal.  Vitals reviewed.    Assessment & Plan:    1. Depression with anxiety - Refill escitalopram (LEXAPRO) 10 MG tablet; Take 1 tablet (10 mg total) by mouth daily.  Dispense: 30 tablet; Refill: 2  2. Screening for HIV (human immunodeficiency virus) - HIV antibody  3. Metrorrhagia - US Pelvis Complete; Future - US Transvaginal Non-OB; Future   Meds ordered this encounter  Medications  . escitalopram (LEXAPRO) 10 MG tablet    Sig: Take 1 tablet (10 mg total) by mouth daily.    Dispense:  30 tablet    Refill:  2    Order Specific Question:   Supervising Provider    Answer:   Quentin AngstJEGEDE, OLUGBEMIGA E [id number]    Follow-up: Return in about 8 weeks (around 12/04/2017) for depression with anxiety.   Loletta Specteroger David Laurelle Skiver PA

## 2017-10-10 ENCOUNTER — Ambulatory Visit: Payer: Self-pay | Attending: Physician Assistant | Admitting: *Deleted

## 2017-10-10 ENCOUNTER — Telehealth (INDEPENDENT_AMBULATORY_CARE_PROVIDER_SITE_OTHER): Payer: Self-pay

## 2017-10-10 DIAGNOSIS — Z23 Encounter for immunization: Secondary | ICD-10-CM | POA: Insufficient documentation

## 2017-10-10 LAB — HIV ANTIBODY (ROUTINE TESTING W REFLEX): HIV Screen 4th Generation wRfx: NONREACTIVE

## 2017-10-10 NOTE — Telephone Encounter (Signed)
-----   Message from Loletta Specteroger David Gomez, PA-C sent at 10/10/2017  8:46 AM EST ----- HIV negative.

## 2017-10-10 NOTE — Progress Notes (Signed)
tdap- left Flu r  Patient here today for Tdap  injection. Administered in left deltoid.  Site unremarkable & patient tolerated injection.     Patient here today for influenza injection.  Administered in left deltoid.  Site unremarkable & patient tolerated injection.

## 2017-10-10 NOTE — Telephone Encounter (Signed)
Using pacific interpreters [phone]) patient aware of negative HIV results. Colleen Mornempestt S Colleen Booth, CMA

## 2017-10-12 ENCOUNTER — Ambulatory Visit (HOSPITAL_COMMUNITY): Payer: Self-pay

## 2017-12-04 ENCOUNTER — Ambulatory Visit (INDEPENDENT_AMBULATORY_CARE_PROVIDER_SITE_OTHER): Payer: Self-pay | Admitting: Physician Assistant

## 2020-03-11 ENCOUNTER — Emergency Department (HOSPITAL_COMMUNITY): Payer: Self-pay

## 2020-03-11 ENCOUNTER — Other Ambulatory Visit: Payer: Self-pay

## 2020-03-11 ENCOUNTER — Emergency Department (HOSPITAL_COMMUNITY)
Admission: EM | Admit: 2020-03-11 | Discharge: 2020-03-11 | Disposition: A | Discharge status: Home / Self Care | Payer: Self-pay | Attending: Emergency Medicine | Admitting: Emergency Medicine

## 2020-03-11 ENCOUNTER — Encounter (HOSPITAL_COMMUNITY): Payer: Self-pay | Admitting: Emergency Medicine

## 2020-03-11 DIAGNOSIS — Z20822 Contact with and (suspected) exposure to covid-19: Secondary | ICD-10-CM | POA: Insufficient documentation

## 2020-03-11 DIAGNOSIS — Z79899 Other long term (current) drug therapy: Secondary | ICD-10-CM | POA: Insufficient documentation

## 2020-03-11 DIAGNOSIS — R0602 Shortness of breath: Secondary | ICD-10-CM | POA: Insufficient documentation

## 2020-03-11 LAB — BASIC METABOLIC PANEL
Anion gap: 9 (ref 5–15)
BUN: 6 mg/dL (ref 6–20)
CO2: 26 mmol/L (ref 22–32)
Calcium: 8.8 mg/dL — ABNORMAL LOW (ref 8.9–10.3)
Chloride: 100 mmol/L (ref 98–111)
Creatinine, Ser: 0.71 mg/dL (ref 0.44–1.00)
GFR calc Af Amer: >60 mL/min (ref >60)
GFR calc non Af Amer: >60 mL/min (ref >60)
Glucose, Bld: 118 mg/dL — ABNORMAL HIGH (ref 70–99)
Potassium: 3.5 mmol/L (ref 3.5–5.1)
Sodium: 135 mmol/L (ref 135–145)

## 2020-03-11 LAB — CBC
HCT: 37.5 % (ref 36.0–46.0)
Hemoglobin: 12.5 g/dL (ref 12.0–15.0)
MCH: 30.3 pg (ref 26.0–34.0)
MCHC: 33.3 g/dL (ref 30.0–36.0)
MCV: 91 fL (ref 80.0–100.0)
Platelets: 268 10*3/uL (ref 150–400)
RBC: 4.12 MIL/uL (ref 3.87–5.11)
RDW: 12.6 % (ref 11.5–15.5)
WBC: 6.7 10*3/uL (ref 4.0–10.5)
nRBC: 0 % (ref 0.0–0.2)

## 2020-03-11 LAB — SARS CORONAVIRUS 2 (TAT 6-24 HRS): SARS Coronavirus 2: NEGATIVE

## 2020-03-11 MED ORDER — DEXAMETHASONE SODIUM PHOSPHATE 10 MG/ML IJ SOLN: 10.0000 mg | Freq: Once | INTRAMUSCULAR | Status: DC

## 2020-03-11 MED ORDER — ALBUTEROL SULFATE HFA 108 (90 BASE) MCG/ACT IN AERS
4.0000 | INHALATION_SPRAY | Freq: Once | RESPIRATORY_TRACT | Status: AC
Start: 2020-03-11 — End: 2020-03-11
  Administered 2020-03-11: 16:00:00 4 via RESPIRATORY_TRACT
  Filled 2020-03-11: qty 6.7

## 2020-03-11 MED ORDER — PREDNISONE 10 MG (21) PO TBPK
ORAL_TABLET | Freq: Every day | ORAL | 0 refills | Status: AC
Start: 2020-03-11 — End: ?

## 2020-03-11 MED ORDER — DEXAMETHASONE SODIUM PHOSPHATE 10 MG/ML IJ SOLN
10.0000 mg | Freq: Once | INTRAMUSCULAR | Status: AC
  Administered 2020-03-11: 10 mg via INTRAMUSCULAR
  Filled 2020-03-11: qty 1

## 2020-03-11 MED FILL — predniSONE 10 MG TABS: 10 | 10 days supply | Qty: 42 | Fill #0

## 2020-03-11 NOTE — ED Notes (Signed)
Patient Alert and oriented to baseline. Stable and ambulatory to baseline. Patient verbalized understanding of the discharge instructions.  Patient belongings were taken by the patient.   

## 2020-03-11 NOTE — ED Provider Notes (Signed)
[facility] EMERGENCY DEPARTMENT Provider Note   CSN: [id number] Arrival date & time: 03/11/20  1108     History Chief Complaint  Patient presents with  . Shortness of Breath    Colleen Booth is a 44 y.o. female.  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  In January 2020 she had tracheal dilation with recurrence of tracheal stenosis which resulted in tracheal dilation and steroid injection 5/20.   Patient presents today with which she states feels like similar symptoms to her subglottic stenosis with shortness of breath seems to be worse when she is exercising or exerting herself in a way that requires harder breathing.   Patient states that she has had some cough, congestion, sore throat, subjective fevers and chills but denies any true fevers at home.  Patient follows with [facility] and was in [facility] with Dr. Juanito Doom.  She states that with her insurance she is currently unable to afford additional dilation surgeries/procedures.      History reviewed. No pertinent past medical history.  There are no problems to display for this patient.   History reviewed. No pertinent surgical history.   OB History   No obstetric history on file.     No family history on file.  Social History   Tobacco Use  . Smoking status: Never Smoker  . Smokeless tobacco: Never Used  Substance Use Topics  . Alcohol use: No  . Drug use: No    Home Medications Prior to Admission medications   Medication Sig Start Date End Date Taking? Authorizing Provider  escitalopram (LEXAPRO) 10 MG tablet Take 1 tablet (10 mg total) by mouth daily. 10/09/17   Clent Demark, PA-C  predniSONE (STERAPRED UNI-PAK 21 TAB) 10 MG (21) TBPK tablet Take by mouth daily. Take 6 tabs by mouth daily  for 2 days, then 5 tabs for 2 days, then 4 tabs for 2 days,  then 3 tabs for 2 days, 2 tabs for 2 days, then 1 tab by mouth daily for 2 days 03/11/20   Tedd Sias, PA    Allergies    Patient has no known allergies.  Review of Systems   Review of Systems  Constitutional: Negative for chills and fever.  HENT: Negative for congestion.   Eyes: Negative for pain.  Respiratory: Positive for shortness of breath. Negative for cough.   Cardiovascular: Negative for chest pain and leg swelling.  Gastrointestinal: Negative for abdominal pain and vomiting.  Genitourinary: Negative for dysuria.  Musculoskeletal: Negative for myalgias.  Skin: Negative for rash.  Neurological: Negative for dizziness and headaches.    Physical Exam Updated Vital Signs BP 105/72   Pulse 83   Temp 99.5 F (37.5 C) (Oral)   Resp 17   LMP 03/10/2020 (Exact Date)   SpO2 100%    Physical Exam Vitals and nursing note reviewed.  Constitutional:      General: She is not in acute distress. HENT:     Head: Normocephalic and atraumatic.     Nose: Nose normal.     Mouth/Throat:     Mouth: Mucous membranes are moist.  Eyes:     General: No scleral icterus. Cardiovascular:     Rate and Rhythm: Normal rate and regular rhythm.     Pulses: Normal pulses.     Heart sounds: Normal heart sounds.  Pulmonary:  Effort: Pulmonary effort is normal. No respiratory distress.     Comments: Patient speaking full sentences without difficulty.  No accessory muscle usage.  Upper airway noises auscultated as inspiratory and expiratory whistling. No true stridor.  Abdominal:     Palpations: Abdomen is soft.     Tenderness: There is no abdominal tenderness. There is no guarding.  Musculoskeletal:     Cervical back: Normal range of motion.     Right lower leg: No edema.     Left lower leg: No edema.  Skin:    General: Skin is warm and dry.     Capillary Refill: Capillary refill takes less than 2 seconds.  Neurological:     Mental Status: She is alert. Mental status is at  baseline.  Psychiatric:        Mood and Affect: Mood normal.        Behavior: Behavior normal.     ED Results / Procedures / Treatments   Labs (all labs ordered are listed, but only abnormal results are displayed) Labs Reviewed  BASIC METABOLIC PANEL - Abnormal; Notable for the following components:      Result Value   Glucose, Bld 118 (*)    Calcium 8.8 (*)    All other components within normal limits  SARS CORONAVIRUS 2 (TAT 6-24 HRS)  CBC    EKG EKG Interpretation  Date/Time:  Thursday Mar 11 2020 11:10:11 EDT Ventricular Rate:  95 PR Interval:  148 QRS Duration: 78 QT Interval:  332 QTC Calculation: 417 R Axis:   86 Text Interpretation: Normal sinus rhythm Right atrial enlargement Cannot rule out Anterior infarct , age undetermined Abnormal ECG No STEMI Confirmed by Alvester Chou [id number]) on 03/11/2020 2:37:36 PM   Radiology DG Chest 2 View  Result Date: 03/11/2020 CLINICAL DATA:  Shortness breath started yesterday. Mild nausea for 2 days. EXAM: CHEST - 2 VIEW COMPARISON:  None. FINDINGS: The heart size and mediastinal contours are within normal limits. Both lungs are clear. The visualized skeletal structures are unremarkable. IMPRESSION: No active cardiopulmonary disease. Electronically Signed   By: Norva Pavlov M.D.   On: 03/11/2020 11:43    Procedures Procedures (including critical care time)  Medications Ordered in ED Medications  albuterol (VENTOLIN HFA) 108 (90 Base) MCG/ACT inhaler 4 puff (4 puffs Inhalation Given 03/11/20 1608)  dexamethasone (DECADRON) injection 10 mg (10 mg Intramuscular Given 03/11/20 1608)    ED Course  I have reviewed the triage vital signs and the nursing notes.  Pertinent labs & imaging results that were available during my care of the patient were reviewed by me and considered in my medical decision making (see chart for details).     Clinical Course as of Mar 11 1617  Thu Mar 11, 2020  1505 Chest x-ray independently viewed  by myself there is no evidence of acute cardiopulmonary disease.  No infiltrate or evidence of infection.  No pneumothorax.   [WF]  1506 EKG intimately viewed by myself.  There is no acute abnormality.  No evidence of ischemia.   [WF]  1551 CBC without acute abnormality.  CMP without acute abnormality.  Covid test obtained and pending.   [WF]    Clinical Course User Index [WF] Gailen Shelter, [place]   MDM Rules/Calculators/A&P                        Patient is 44 year old female with a past medical history detailed above.  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  Most recently 1 year ago in May 2020.  Patient has history consistent with URI which may be acutely worsening her subglottic stenosis.  She is not acutely hypoxic, exam has no accessory muscle use or evidence of hypoxia.  She is well-appearing and apart from his right expiratory stridor which has been documented on past exams for the past year has no abnormal findings.  Plan is to ambulate patient to make sure that she does not become hypoxic.  Will provide her with Decadron and nebulizer   I discussed this case with my attending physician who cosigned this note including patient's presenting symptoms, physical exam, and planned diagnostics and interventions. Attending physician stated agreement with plan or made changes to plan which were implemented.   Attending physician assessed patient at bedside.  Discussed with Dr. Sharen Counter of [facility].  Per Dr. Sharen Counter recommneds Decadron and albuterol inhaler.  Plan to discharge with prednisone Dosepak, humidified air recommendations and strict return precautions.  She can follow-up with [facility] tomorrow or Monday.  I would recommend that she call the practice to ensure this however her ENT has assured me that she will have close follow-up arranged.  Ambulated without issue does not become hypoxic.  Will discharge with steroid  Dosepak and conditions to use humidified air.  She has close follow-up and return precautions.  Final Clinical Impression(s) / ED Diagnoses Final diagnoses:  Shortness of breath    Rx / DC Orders ED Discharge Orders         Ordered    predniSONE (STERAPRED UNI-PAK 21 TAB) 10 MG (21) TBPK tablet  Daily     03/11/20 1558           Solon Augusta [place], [place] 03/11/20 1619    Terald Sleeper, MD 03/11/20 1806

## 2020-03-11 NOTE — ED Triage Notes (Signed)
Spanish interpretor utilized to obtain history.  Pt reports sob, cough and fever. Pt reports cough is productive. Denies recent sick contacts. Pt reports that she has a problems with her vocal cords "closing" and reports she had a surgery but feels like this is occurring again.

## 2020-03-11 NOTE — ED Notes (Signed)
Pt ambulated in hall. Oxygen saturation remained at 99%-100%. Pt is now back in bed.

## 2020-03-11 NOTE — Discharge Instructions (Signed)
Please call today to verify your appointment with Dr. Sharen Counter of ear nose and throat.  Please take prednisone as prescribed.  If your symptoms worsen during open hours for ear nose and throat clinic please be seen at your ear nose and throat clinic as they have requested this.  However if you have suddenly worsening symptoms please go immediately to the closest emergency room.    Sung Amabile, MD   [address]   [place], [place] [address]   [phone]   [phone] (Fax)

## 2021-06-09 IMAGING — CR DG CHEST 2V
3 series · 4 of 4 positions shown · non-contrast
Comparison: None.

CLINICAL DATA: Shortness breath started yesterday. Mild nausea for
2 days.

EXAM:
CHEST - 2 VIEW

[chest lat]
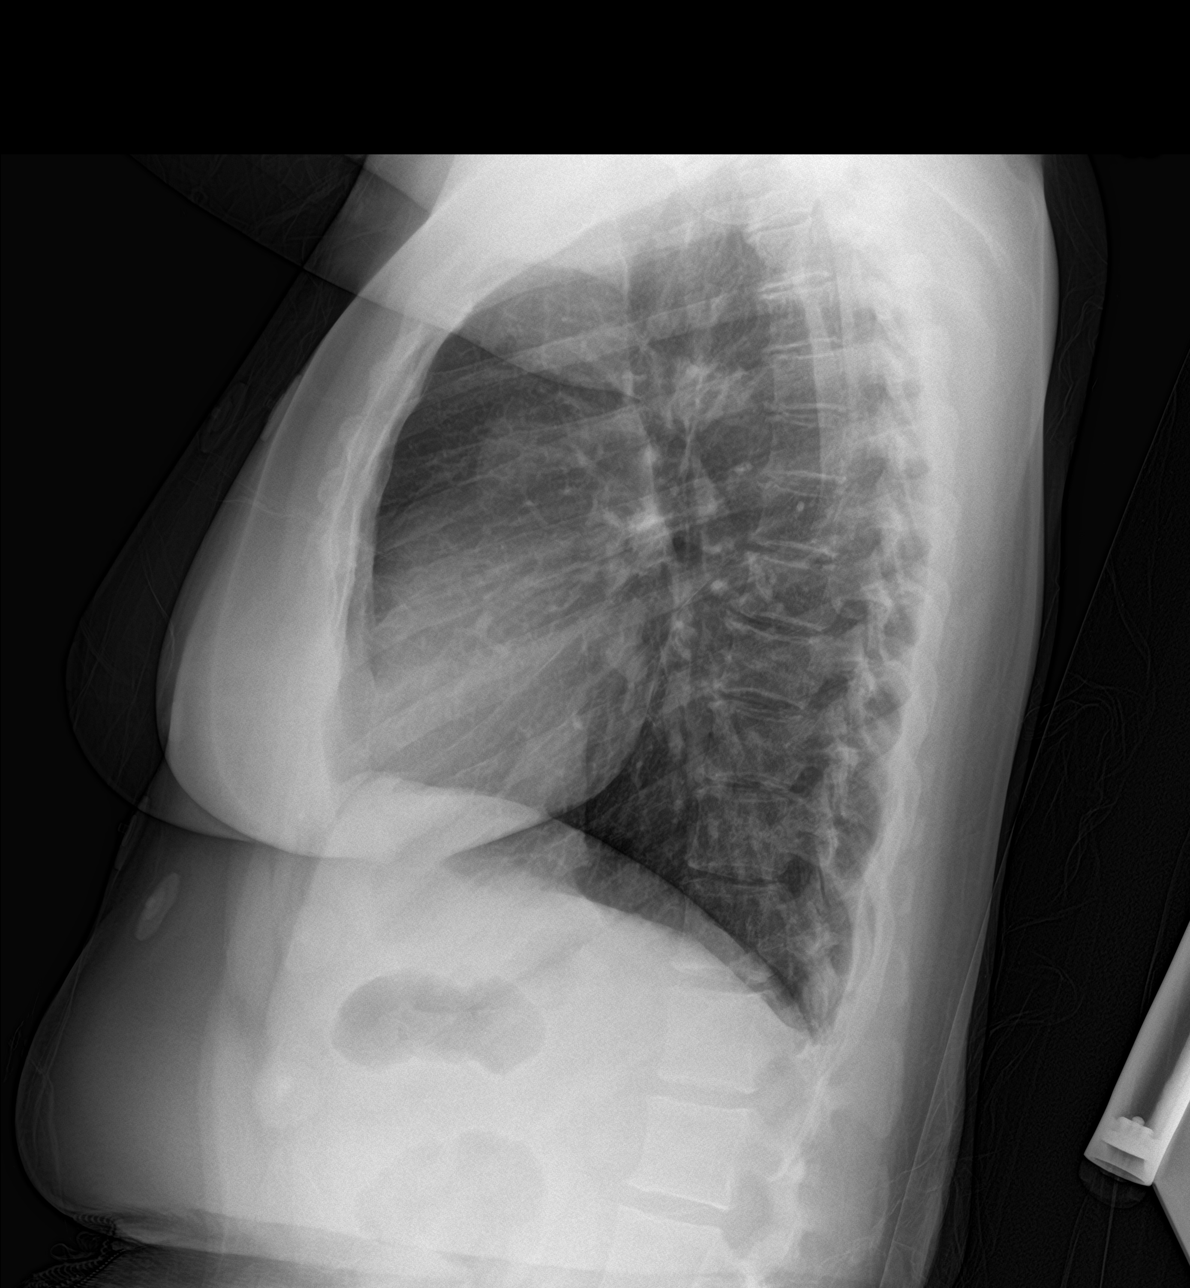

[Series 3: chest ap · 0.14mm/px · 2 of 2 slices shown (1 of 2)]
[im 1/2]
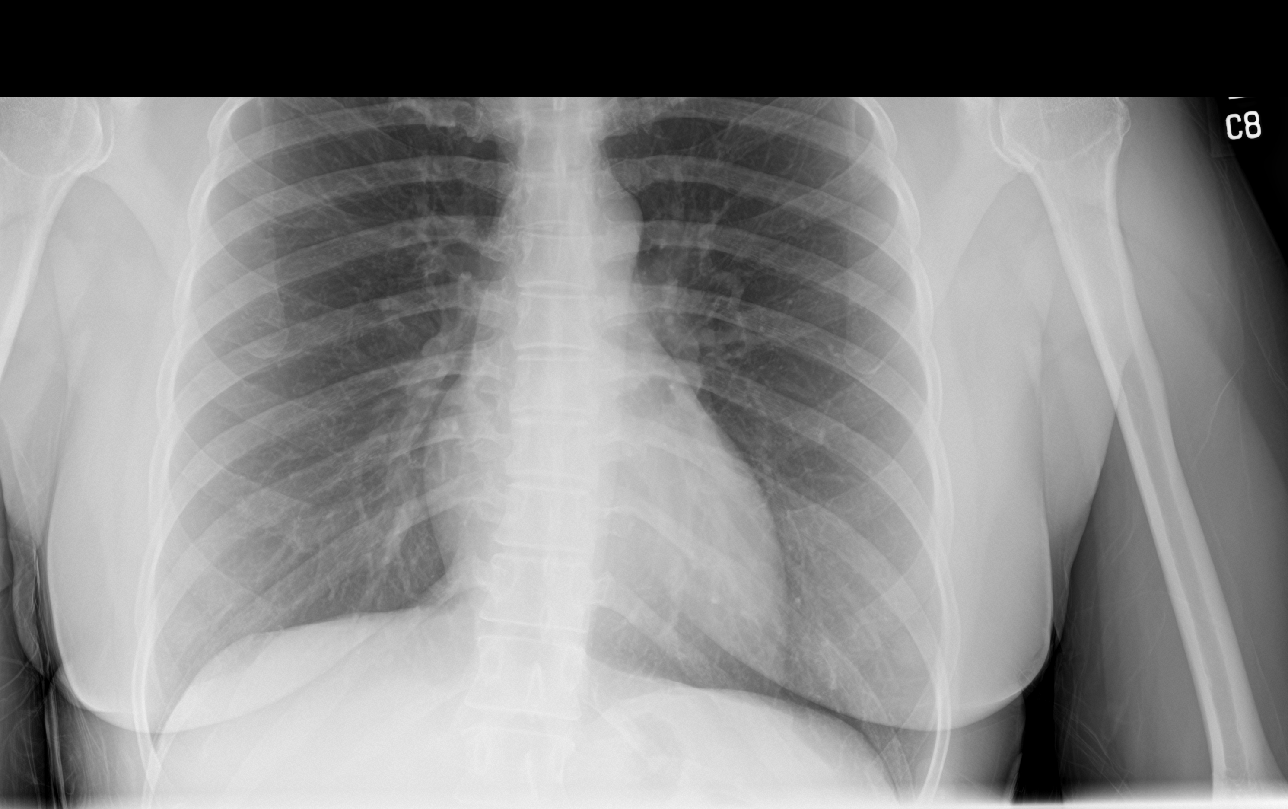
[im 2/2]
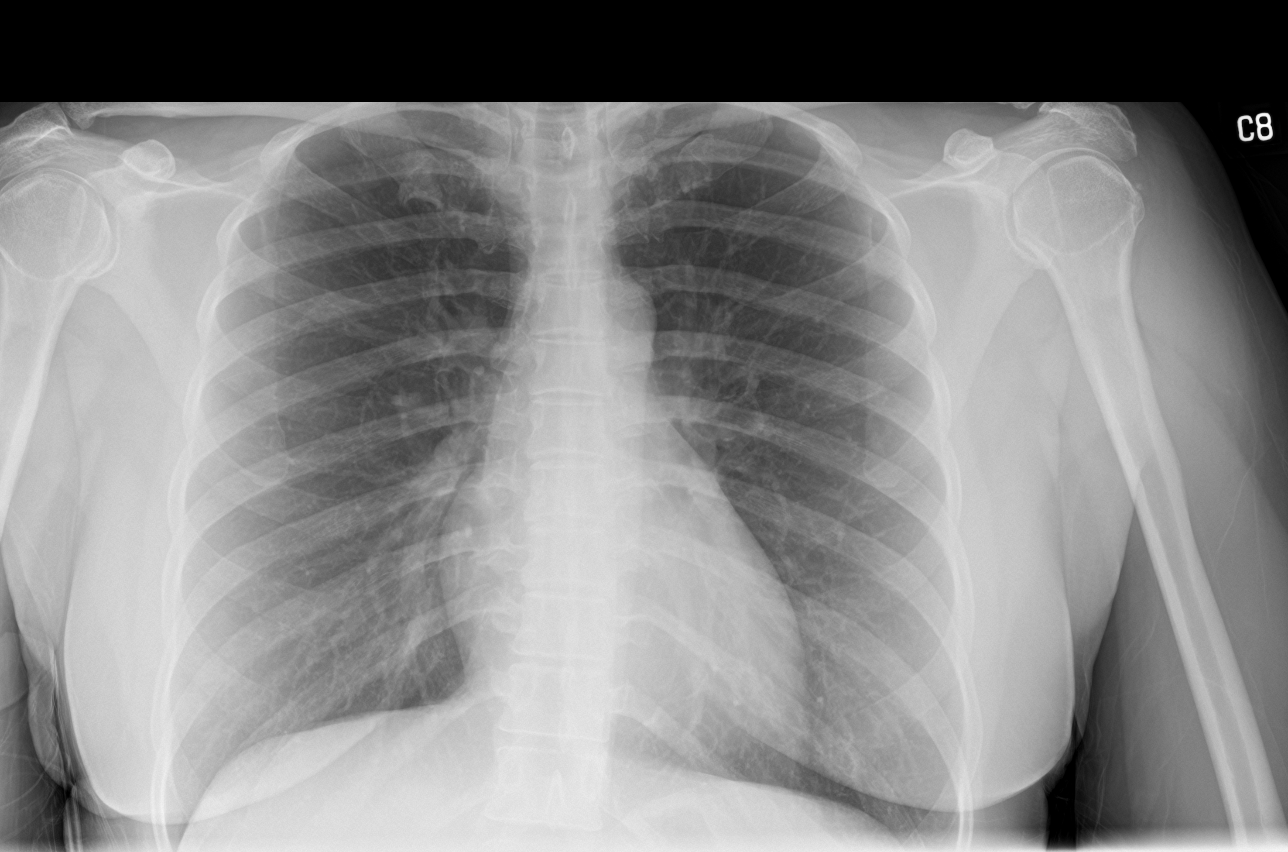

[chest ap (2 of 2)]
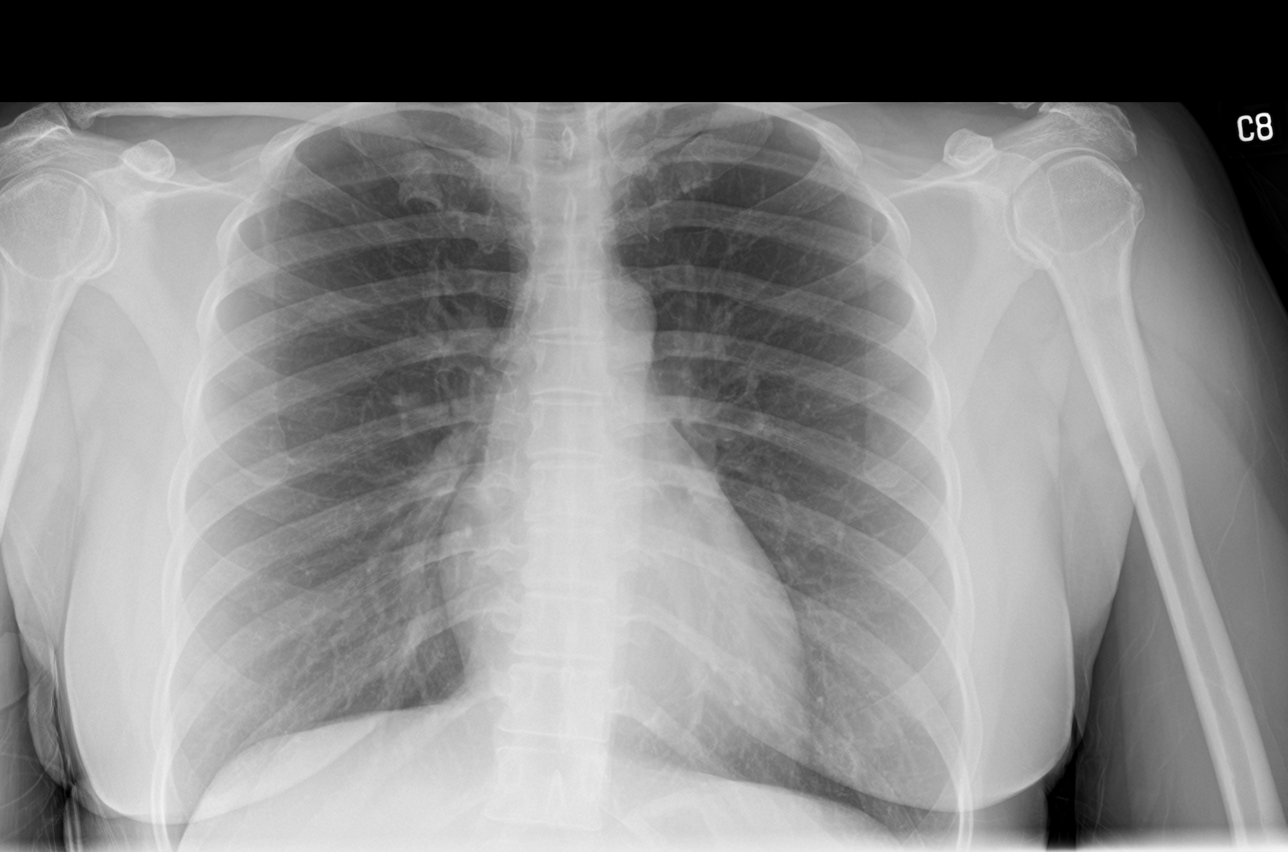

[4 of 4 positions shown; findings below may reference images not displayed]

FINDINGS: The heart size and mediastinal contours are within normal limits.
Both lungs are clear. The visualized skeletal structures are
unremarkable.
IMPRESSION: No active cardiopulmonary disease.

## 2023-06-27 ENCOUNTER — Other Ambulatory Visit: Payer: Self-pay | Admitting: Obstetrics & Gynecology

## 2023-06-27 DIAGNOSIS — Z1231 Encounter for screening mammogram for malignant neoplasm of breast: Secondary | ICD-10-CM

## 2023-08-09 ENCOUNTER — Ambulatory Visit
Admission: RE | Admit: 2023-08-09 | Discharge: 2023-08-09 | Disposition: A | Discharge status: Home / Self Care | Payer: No Typology Code available for payment source | Source: Ambulatory Visit | Attending: Obstetrics & Gynecology | Admitting: Obstetrics & Gynecology

## 2023-08-09 DIAGNOSIS — Z1231 Encounter for screening mammogram for malignant neoplasm of breast: Secondary | ICD-10-CM

## 2024-10-16 ENCOUNTER — Telehealth: Payer: Self-pay

## 2024-10-16 NOTE — Telephone Encounter (Signed)
 Telephoned patient at mobile number using interpreter, Mliss Rhein. Patient requested mammogram scholarship application. [facility]
